# Patient Record
Sex: Female | Born: 1950 | Race: White | Hispanic: No | Marital: Single | Smoking: Never smoker
Health system: Southern US, Community
[De-identification: ages and names within clinical notes are randomized; demographics above are authoritative.]

## PROBLEM LIST (undated history)

## (undated) DIAGNOSIS — F32A Depression, unspecified: Secondary | ICD-10-CM

## (undated) DIAGNOSIS — D860 Sarcoidosis of lung: Secondary | ICD-10-CM

## (undated) DIAGNOSIS — G4730 Sleep apnea, unspecified: Secondary | ICD-10-CM

## (undated) DIAGNOSIS — I10 Essential (primary) hypertension: Secondary | ICD-10-CM

## (undated) DIAGNOSIS — F329 Major depressive disorder, single episode, unspecified: Secondary | ICD-10-CM

## (undated) DIAGNOSIS — Z9109 Other allergy status, other than to drugs and biological substances: Secondary | ICD-10-CM

## (undated) DIAGNOSIS — M1990 Unspecified osteoarthritis, unspecified site: Secondary | ICD-10-CM

## (undated) HISTORY — PX: TONSILLECTOMY: SUR1361

## (undated) HISTORY — PX: ABDOMINAL HYSTERECTOMY: SHX81

---

## 1991-03-23 HISTORY — PX: BREAST SURGERY: SHX581

## 1993-03-22 HISTORY — PX: NASAL SINUS SURGERY: SHX719

## 1998-03-22 HISTORY — PX: ROTATOR CUFF REPAIR: SHX139

## 2000-03-22 DIAGNOSIS — D860 Sarcoidosis of lung: Secondary | ICD-10-CM

## 2000-03-22 HISTORY — DX: Sarcoidosis of lung: D86.0

## 2005-03-22 HISTORY — PX: JOINT REPLACEMENT: SHX530

## 2006-02-21 ENCOUNTER — Ambulatory Visit: Payer: Self-pay | Admitting: Internal Medicine

## 2007-12-27 ENCOUNTER — Encounter: Admission: RE | Admit: 2007-12-27 | Discharge: 2007-12-27 | Payer: Self-pay | Admitting: Family Medicine

## 2008-03-22 HISTORY — PX: LAPAROSCOPIC GASTRIC BANDING: SHX1100

## 2009-01-22 ENCOUNTER — Encounter: Admission: RE | Admit: 2009-01-22 | Discharge: 2009-01-22 | Payer: Self-pay | Admitting: Family Medicine

## 2010-01-26 ENCOUNTER — Encounter: Admission: RE | Admit: 2010-01-26 | Discharge: 2010-01-26 | Payer: Self-pay | Admitting: Family Medicine

## 2010-04-13 ENCOUNTER — Encounter: Payer: Self-pay | Admitting: Family Medicine

## 2010-08-07 NOTE — Assessment & Plan Note (Signed)
[facility]                             PULMONARY OFFICE NOTE   BRITTANI, PURDUM                         MRN:          [id number]  DATE:02/21/2006                            DOB:          1950-07-30    Monday, February 21, 2006   REFERRING PHYSICIAN:  Tally Joe, M.D.   REASON FOR CONSULTATION:  Sarcoidosis evaluation.   HISTORY:  A 60 year old white female never smoker with a history of  moderate obesity and chronic fatigue that crescendoed in 2005 associated  with cough and dyspnea with an abnormal chest x-ray leading ultimately  to a transbronchial biopsy showing noncaseating granulomas in a study  dated April 2005.  She was placed on prednisone initially she says at 20  mg per day but required up to 60 mg per day because of refractory  symptoms of cough, dyspnea and fatigue with marked elevation of ACE  levels (not available, however, for review today).  Ultimately, she was  weaned off of prednisone and has not been on it for over a year with no  significant flare up of symptoms.  She has a few myalgias and  arthralgias but nothing out of the ordinary for her.  She denies any  ocular complaints, fevers, chills, sweats, significant dyspnea or cough  or rash.   PAST MEDICAL HISTORY:  1. Hypertension.  2. She is status post hysterectomy.  3. Sinus surgery.   ALLERGIES:  NO KNOWN DRUG ALLERGIES.   MEDICATIONS:  Taken in detail on the worksheet column dated February 21, 2006, correct as listed.   SOCIAL HISTORY:  She has never smoked.  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   FAMILY HISTORY:  Positive for heart disease in her mother.  Negative for  sarcoid or rheumatism.   REVIEW OF SYSTEMS:  Taken in detail on worksheet and negative except as  outlined above.   PHYSICAL EXAMINATION:  GENERAL APPEARANCE:  This is a pleasant,  ambulatory, obese white female weighing now at 238 (versus a peak of 263  on prednisone).  HEENT:  A limited funduscopy revealing no obvious arterial change.  Ocular exam was completely benign.  Oropharynx is clear.  Dentition is  intact.  Ear canals clear bilaterally.  NECK:  Supple without cervical adenopathy or tenderness.  Trachea is  midline.  No thyromegaly.  LUNGS:  Lung fields are perfectly clear to auscultation and percussion  bilaterally with no adventitial breath sounds.  CARDIOVASCULAR:  There is a regular rhythm without murmurs, rubs, or  gallops.  No increase P2.  ABDOMEN:  Obese, benign, no palpable organomegaly, masses or tenderness.  EXTREMITIES:  Warm without calf tenderness, clubbing, cyanosis, or  edema.  NEUROLOGIC:  No deficits present.  SKIN:  Unremarkable.  MUSCULOSKELETAL:  Unremarkable in terms of hip, knees, hands and wrists  with no evidence of active synovitis.   Chest x-ray was performed at [facility] within the last month, it has  been requested.   IMPRESSION:  No evidence of active sarcoidosis having been diagnosed by  transbronchial biopsy in April 2005 and on  prednisone for about a year,  albeit at much higher doses than we normally recommend here.  She has  not had any symptoms that would suggest recurrent disease since she  finished the prednisone over a year ago.  She has some mild myalgias and  arthralgias which are probably related to the effect of excess weight on  joints involving the hips and knees (whereas most patients with sarcoid  have prominent hand, wrist and ankle involvement).   I reviewed with the patient the natural history of sarcoid including the  fact that it tends to burn itself out in most patients by the end of  three years, which she is now approaching.  I did not recommend any  specific therapy but did give her symptoms to look for that would  indicate the need to return here with her old x-rays available for  comparison purposes.  Otherwise, all follow-up can be through Dr.  Merita Norton office with a chest x-ray  yearly.     Charlaine Dalton. Sherene Sires, MD, [facility]  Electronically Signed    MBW/MedQ  DD: 02/21/2006  DT: 02/22/2006  Job #: [id number]

## 2011-03-23 HISTORY — PX: COMBINED ABDOMINOPLASTY AND LIPOSUCTION: SUR284

## 2011-10-07 ENCOUNTER — Other Ambulatory Visit: Payer: Self-pay | Admitting: Orthopedic Surgery

## 2011-10-13 ENCOUNTER — Encounter (HOSPITAL_COMMUNITY): Payer: Self-pay | Admitting: Respiratory Therapy

## 2011-10-14 ENCOUNTER — Encounter (HOSPITAL_COMMUNITY): Payer: Self-pay

## 2011-10-14 ENCOUNTER — Encounter (HOSPITAL_COMMUNITY)
Admission: RE | Admit: 2011-10-14 | Discharge: 2011-10-14 | Disposition: A | Discharge status: Home / Self Care | Payer: BC Managed Care – PPO | Source: Ambulatory Visit | Attending: Orthopedic Surgery | Admitting: Orthopedic Surgery

## 2011-10-14 HISTORY — DX: Sarcoidosis of lung: D86.0

## 2011-10-14 HISTORY — DX: Sleep apnea, unspecified: G47.30

## 2011-10-14 HISTORY — DX: Other allergy status, other than to drugs and biological substances: Z91.09

## 2011-10-14 HISTORY — DX: Essential (primary) hypertension: I10

## 2011-10-14 HISTORY — DX: Depression, unspecified: F32.A

## 2011-10-14 HISTORY — DX: Major depressive disorder, single episode, unspecified: F32.9

## 2011-10-14 HISTORY — DX: Unspecified osteoarthritis, unspecified site: M19.90

## 2011-10-14 LAB — CBC
Hemoglobin: 12.9 g/dL (ref 12.0–15.0)
MCHC: 33.8 g/dL (ref 30.0–36.0)
RBC: 4.19 MIL/uL (ref 3.87–5.11)

## 2011-10-14 LAB — URINALYSIS, ROUTINE W REFLEX MICROSCOPIC
Nitrite: NEGATIVE
Specific Gravity, Urine: 1.023 (ref 1.005–1.030)
pH: 6.5 (ref 5.0–8.0)

## 2011-10-14 LAB — COMPREHENSIVE METABOLIC PANEL
ALT: 19 U/L (ref 0–35)
Alkaline Phosphatase: 85 U/L (ref 39–117)
GFR calc Af Amer: >90 mL/min (ref >90)
Glucose, Bld: 79 mg/dL (ref 70–99)
Potassium: 4.2 mEq/L (ref 3.5–5.1)
Sodium: 140 mEq/L (ref 135–145)
Total Protein: 6.7 g/dL (ref 6.0–8.3)

## 2011-10-14 LAB — SURGICAL PCR SCREEN
MRSA, PCR: NEGATIVE
Staphylococcus aureus: NEGATIVE

## 2011-10-14 LAB — DIFFERENTIAL
Eosinophils Absolute: 0.2 10*3/uL (ref 0.0–0.7)
Eosinophils Relative: 2 % (ref 0–5)
Lymphs Abs: 2.7 10*3/uL (ref 0.7–4.0)
Monocytes Absolute: 0.9 10*3/uL (ref 0.1–1.0)
Monocytes Relative: 8 % (ref 3–12)

## 2011-10-14 LAB — APTT: aPTT: 28 seconds (ref 24–37)

## 2011-10-14 LAB — PROTIME-INR: Prothrombin Time: 12.4 seconds (ref 11.6–15.2)

## 2011-10-14 LAB — URINE MICROSCOPIC-ADD ON

## 2011-10-14 LAB — TYPE AND SCREEN

## 2011-10-14 NOTE — Pre-Procedure Instructions (Signed)
20 Lacey Silva  10/14/2011   Your procedure is scheduled on:  July 31st  Report to [facility] at 1100 AM.  Call this number if you have problems the morning of surgery: [phone]   Remember:   Do not eat food or drink:After Midnight.  Take these medicines the morning of surgery with A SIP OF WATER: cymbalta, claritin   Do not wear jewelry, make-up or nail polish.  Do not wear lotions, powders, or perfumes.   Do not shave 48 hours prior to surgery. Men may shave face and neck.  Do not bring valuables to the hospital.  Contacts, dentures or bridgework may not be worn into surgery.  Leave suitcase in the car. After surgery it may be brought to your room.  For patients admitted to the hospital, checkout time is 11:00 AM the day of discharge.   Patients discharged the day of surgery will not be allowed to drive home.  Special Instructions: CHG Shower Use Special Wash: 1/2 bottle night before surgery and 1/2 bottle morning of surgery.   Please read over the following fact sheets that you were given: Pain Booklet, Coughing and Deep Breathing, MRSA Information and Surgical Site Infection Prevention

## 2011-10-15 NOTE — Anesthesia Preprocedure Evaluation (Addendum)
Anesthesia Evaluation  Patient identified by MRN, date of birth, ID band Patient awake  General Assessment Comment:Glidescope utilized with last two surgeries at [facility].  Reviewed: Allergy & Precautions, H&P , NPO status , Patient's Chart, lab work & pertinent test results  History of Anesthesia Complications (+) DIFFICULT AIRWAY  Airway Mallampati: II TM Distance: <3 FB Neck ROM: full    Dental  (+) Teeth Intact and Dental Advisory Given   Pulmonary sleep apnea and Continuous Positive Airway Pressure Ventilation ,  breath sounds clear to auscultation        Cardiovascular hypertension, Pt. on medications Rhythm:regular Rate:Normal     Neuro/Psych PSYCHIATRIC DISORDERS    GI/Hepatic   Endo/Other    Renal/GU      Musculoskeletal   Abdominal   Peds  Hematology   Anesthesia Other Findings   Reproductive/Obstetrics                         Anesthesia Physical Anesthesia Plan  ASA: II  Anesthesia Plan: General   Post-op Pain Management:    Induction: Intravenous  Airway Management Planned: Oral ETT  Additional Equipment:   Intra-op Plan:   Post-operative Plan: Extubation in OR  Informed Consent: I have reviewed the patients History and Physical, chart, labs and discussed the procedure including the risks, benefits and alternatives for the proposed anesthesia with the patient or authorized representative who has indicated his/her understanding and acceptance.     Plan Discussed with: CRNA, Anesthesiologist and Surgeon  Anesthesia Plan Comments:         Anesthesia Quick Evaluation

## 2011-10-15 NOTE — Consult Note (Signed)
Anesthesia Chart Review:  Patient is a 61 year old female scheduled for C4-5, C5-6, C6-7 ACDF on 10/20/11 by Dr. Yevette Edwards.  History includes HTN, non-smoker, OSA, Sarcoidosis arthritis, obesity with BMI 38 s/p gastric banding procedure, depression, hysterectomy, joint replacement, breast and nasal sinus surgeries.    She also reports that years ago in [place] she was told she was a DIFFICULT INTUBATION due to a "short neck."  She has since had two surgeries at [facility] where she told them of her anesthesia history, and did not have any significant trouble.  I reviewed her 10/24/06 (TKR) and 02/13/08 (laparoscopic gastric banding) anesthesia records from [facility].  A glidescope was utilized.  Records are on her chart for her Anesthesiologist to review on the day of surgery.    Labs noted.  CXR from 10/14/11 showed: Findings: Cardiac silhouette is upper normal size. There is slight ectasia and tortuosity of thoracic aorta. No pulmonary infiltrates or nodules are evident. The No pleural abnormality is evident. There is slightly osteopenic appearance of the bones. Changes of degenerative disc disease and degenerative spondylosis are seen. A lap band device is seen in the upper abdomen.   Her EKG done on 10/14/11, while believed to be her EKG, did not show exact patient identifiers, so it was deleted and will need to be done on the day of surgery.  We did receive a prior EKG from [facility] from 01/15/08 that showed SR, poor R wave progression, low r waves versus Q waves in III, aVF.  (The EKG deleted--but with incorrect identifiers-- showed similar findings.)  If her EKG is stable on the day of surgery then anticipate she could proceed as planned.  I reviewed her EKG events and difficult intubation history with Anesthesiologist Dr. Michelle Piper.    Shonna Chock, PA-C

## 2011-10-19 MED ORDER — CLINDAMYCIN PHOSPHATE 900 MG/50ML IV SOLN
900.0000 mg | INTRAVENOUS | Status: DC
  Filled 2011-10-19: qty 50

## 2011-10-19 MED ORDER — POVIDONE-IODINE 7.5 % EX SOLN
Freq: Once | CUTANEOUS | Status: DC
  Filled 2011-10-19: qty 118

## 2011-10-20 ENCOUNTER — Observation Stay (HOSPITAL_COMMUNITY)
Admission: RE | Admit: 2011-10-20 | Discharge: 2011-10-21 | DRG: 865 | Disposition: A | Discharge status: Home / Self Care | Payer: BC Managed Care – PPO | Source: Ambulatory Visit | Attending: Orthopedic Surgery | Admitting: Orthopedic Surgery

## 2011-10-20 ENCOUNTER — Ambulatory Visit (HOSPITAL_COMMUNITY): Payer: BC Managed Care – PPO

## 2011-10-20 ENCOUNTER — Encounter (HOSPITAL_COMMUNITY): Payer: Self-pay | Admitting: Vascular Surgery

## 2011-10-20 ENCOUNTER — Ambulatory Visit (HOSPITAL_COMMUNITY): Payer: BC Managed Care – PPO | Admitting: Vascular Surgery

## 2011-10-20 ENCOUNTER — Encounter (HOSPITAL_COMMUNITY): Payer: Self-pay | Admitting: Certified Registered Nurse Anesthetist

## 2011-10-20 ENCOUNTER — Encounter (HOSPITAL_COMMUNITY)
Admission: RE | Disposition: A | Discharge status: Home / Self Care | Payer: Self-pay | Source: Ambulatory Visit | Attending: Orthopedic Surgery

## 2011-10-20 ENCOUNTER — Encounter (HOSPITAL_COMMUNITY): Payer: Self-pay | Admitting: *Deleted

## 2011-10-20 DIAGNOSIS — M5000 Cervical disc disorder with myelopathy, unspecified cervical region: Principal | ICD-10-CM | POA: Insufficient documentation

## 2011-10-20 DIAGNOSIS — Z01818 Encounter for other preprocedural examination: Secondary | ICD-10-CM | POA: Insufficient documentation

## 2011-10-20 DIAGNOSIS — M1990 Unspecified osteoarthritis, unspecified site: Secondary | ICD-10-CM | POA: Insufficient documentation

## 2011-10-20 DIAGNOSIS — I10 Essential (primary) hypertension: Secondary | ICD-10-CM | POA: Insufficient documentation

## 2011-10-20 DIAGNOSIS — Z01812 Encounter for preprocedural laboratory examination: Secondary | ICD-10-CM | POA: Insufficient documentation

## 2011-10-20 DIAGNOSIS — G959 Disease of spinal cord, unspecified: Secondary | ICD-10-CM

## 2011-10-20 DIAGNOSIS — Z01811 Encounter for preprocedural respiratory examination: Secondary | ICD-10-CM | POA: Insufficient documentation

## 2011-10-20 HISTORY — PX: ANTERIOR CERVICAL DECOMP/DISCECTOMY FUSION: SHX1161

## 2011-10-20 SURGERY — ANTERIOR CERVICAL DECOMPRESSION/DISCECTOMY FUSION 3 LEVELS
Anesthesia: General | Site: Neck | Laterality: Right | Wound class: Clean

## 2011-10-20 MED ORDER — SUCCINYLCHOLINE CHLORIDE 20 MG/ML IJ SOLN
INTRAMUSCULAR | Status: DC | PRN
  Administered 2011-10-20: 120 mg via INTRAVENOUS

## 2011-10-20 MED ORDER — LACTATED RINGERS IV SOLN
INTRAVENOUS | Status: DC
  Administered 2011-10-20: 12:00:00 via INTRAVENOUS

## 2011-10-20 MED ORDER — 0.9 % SODIUM CHLORIDE (POUR BTL) OPTIME
TOPICAL | Status: DC | PRN
  Administered 2011-10-20: 1000 mL

## 2011-10-20 MED ORDER — HYDROMORPHONE HCL PF 1 MG/ML IJ SOLN: 0.2500 mg | INTRAMUSCULAR | Status: DC | PRN

## 2011-10-20 MED ORDER — MENTHOL 3 MG MT LOZG: 1.0000 | LOZENGE | OROMUCOSAL | Status: DC | PRN

## 2011-10-20 MED ORDER — BUPIVACAINE-EPINEPHRINE PF 0.25-1:200000 % IJ SOLN
INTRAMUSCULAR | Status: DC | PRN
  Administered 2011-10-20: 2 mL

## 2011-10-20 MED ORDER — ADULT MULTIVITAMIN W/MINERALS CH
1.0000 | ORAL_TABLET | Freq: Every day | ORAL | Status: DC
  Administered 2011-10-20 – 2011-10-21 (×2): 1 via ORAL
  Filled 2011-10-20 (×2): qty 1

## 2011-10-20 MED ORDER — ALUM & MAG HYDROXIDE-SIMETH 200-200-20 MG/5ML PO SUSP: 30.0000 mL | Freq: Four times a day (QID) | ORAL | Status: DC | PRN

## 2011-10-20 MED ORDER — SODIUM CHLORIDE 0.9 % IV SOLN: 250.0000 mL | INTRAVENOUS | Status: DC

## 2011-10-20 MED ORDER — LOSARTAN POTASSIUM-HCTZ 50-12.5 MG PO TABS: 1.0000 | ORAL_TABLET | Freq: Every day | ORAL | Status: DC

## 2011-10-20 MED ORDER — CEFAZOLIN SODIUM-DEXTROSE 2-3 GM-% IV SOLR
INTRAVENOUS | Status: AC
  Filled 2011-10-20: qty 50

## 2011-10-20 MED ORDER — GLYCOPYRROLATE 0.2 MG/ML IJ SOLN
INTRAMUSCULAR | Status: DC | PRN
  Administered 2011-10-20: .5 mg via INTRAVENOUS

## 2011-10-20 MED ORDER — CEFAZOLIN SODIUM 1-5 GM-% IV SOLN
1.0000 g | Freq: Three times a day (TID) | INTRAVENOUS | Status: AC
  Administered 2011-10-20 – 2011-10-21 (×2): 1 g via INTRAVENOUS
  Filled 2011-10-20 (×2): qty 50

## 2011-10-20 MED ORDER — PROPOFOL 10 MG/ML IV EMUL
INTRAVENOUS | Status: DC | PRN
  Administered 2011-10-20: 280 mg via INTRAVENOUS

## 2011-10-20 MED ORDER — DEXAMETHASONE SODIUM PHOSPHATE 10 MG/ML IJ SOLN
INTRAMUSCULAR | Status: DC | PRN
  Administered 2011-10-20: 10 mg via INTRAVENOUS

## 2011-10-20 MED ORDER — THROMBIN 20000 UNITS EX KIT
PACK | CUTANEOUS | Status: DC | PRN
  Administered 2011-10-20: 14:00:00 via TOPICAL

## 2011-10-20 MED ORDER — VECURONIUM BROMIDE 10 MG IV SOLR
INTRAVENOUS | Status: DC | PRN
  Administered 2011-10-20: 2 mg via INTRAVENOUS
  Administered 2011-10-20: 1 mg via INTRAVENOUS
  Administered 2011-10-20: 3 mg via INTRAVENOUS
  Administered 2011-10-20: 1 mg via INTRAVENOUS
  Administered 2011-10-20 (×2): 2 mg via INTRAVENOUS

## 2011-10-20 MED ORDER — ACETAMINOPHEN 10 MG/ML IV SOLN
INTRAVENOUS | Status: DC | PRN
  Administered 2011-10-20: 1000 mg via INTRAVENOUS

## 2011-10-20 MED ORDER — DOCUSATE SODIUM 100 MG PO CAPS
100.0000 mg | ORAL_CAPSULE | Freq: Two times a day (BID) | ORAL | Status: DC
  Administered 2011-10-20 – 2011-10-21 (×2): 100 mg via ORAL
  Filled 2011-10-20 (×3): qty 1

## 2011-10-20 MED ORDER — BUPIVACAINE-EPINEPHRINE PF 0.25-1:200000 % IJ SOLN
INTRAMUSCULAR | Status: AC
  Filled 2011-10-20: qty 30

## 2011-10-20 MED ORDER — LIDOCAINE HCL (CARDIAC) 20 MG/ML IV SOLN
INTRAVENOUS | Status: DC | PRN
  Administered 2011-10-20: 80 mg via INTRAVENOUS

## 2011-10-20 MED ORDER — LORATADINE 10 MG PO TABS
10.0000 mg | ORAL_TABLET | Freq: Every day | ORAL | Status: DC
  Administered 2011-10-20 – 2011-10-21 (×2): 10 mg via ORAL
  Filled 2011-10-20 (×2): qty 1

## 2011-10-20 MED ORDER — MORPHINE SULFATE 2 MG/ML IJ SOLN
2.0000 mg | INTRAMUSCULAR | Status: DC | PRN
  Administered 2011-10-20: 2 mg via INTRAVENOUS
  Filled 2011-10-20: qty 1

## 2011-10-20 MED ORDER — DIAZEPAM 5 MG PO TABS: 5.0000 mg | ORAL_TABLET | Freq: Four times a day (QID) | ORAL | Status: DC | PRN

## 2011-10-20 MED ORDER — ACETAMINOPHEN 325 MG PO TABS: 650.0000 mg | ORAL_TABLET | ORAL | Status: DC | PRN

## 2011-10-20 MED ORDER — PHENOL 1.4 % MT LIQD: 1.0000 | OROMUCOSAL | Status: DC | PRN

## 2011-10-20 MED ORDER — SODIUM CHLORIDE 0.9 % IJ SOLN: 3.0000 mL | INTRAMUSCULAR | Status: DC | PRN

## 2011-10-20 MED ORDER — FENTANYL CITRATE 0.05 MG/ML IJ SOLN
INTRAMUSCULAR | Status: DC | PRN
  Administered 2011-10-20 (×5): 50 ug via INTRAVENOUS

## 2011-10-20 MED ORDER — ACETAMINOPHEN 10 MG/ML IV SOLN
INTRAVENOUS | Status: AC
  Filled 2011-10-20: qty 100

## 2011-10-20 MED ORDER — HYDROCHLOROTHIAZIDE 12.5 MG PO CAPS
12.5000 mg | ORAL_CAPSULE | Freq: Every day | ORAL | Status: DC
  Administered 2011-10-21: 12.5 mg via ORAL
  Filled 2011-10-20: qty 1

## 2011-10-20 MED ORDER — ONDANSETRON HCL 4 MG/2ML IJ SOLN: 4.0000 mg | Freq: Once | INTRAMUSCULAR | Status: DC | PRN

## 2011-10-20 MED ORDER — DULOXETINE HCL 30 MG PO CPEP
30.0000 mg | ORAL_CAPSULE | Freq: Every day | ORAL | Status: DC
  Administered 2011-10-20 – 2011-10-21 (×2): 30 mg via ORAL
  Filled 2011-10-20 (×2): qty 1

## 2011-10-20 MED ORDER — LIDOCAINE HCL 4 % MT SOLN
OROMUCOSAL | Status: DC | PRN
  Administered 2011-10-20: 4 mL via TOPICAL

## 2011-10-20 MED ORDER — ONDANSETRON HCL 4 MG/2ML IJ SOLN: 4.0000 mg | INTRAMUSCULAR | Status: DC | PRN

## 2011-10-20 MED ORDER — CALCIUM POLYCARBOPHIL 625 MG PO TABS
1250.0000 mg | ORAL_TABLET | Freq: Every day | ORAL | Status: DC
  Administered 2011-10-20: 1250 mg via ORAL
  Filled 2011-10-20 (×2): qty 2

## 2011-10-20 MED ORDER — SODIUM CHLORIDE 0.9 % IJ SOLN: 3.0000 mL | Freq: Two times a day (BID) | INTRAMUSCULAR | Status: DC

## 2011-10-20 MED ORDER — EPHEDRINE SULFATE 50 MG/ML IJ SOLN
INTRAMUSCULAR | Status: DC | PRN
  Administered 2011-10-20: 10 mg via INTRAVENOUS

## 2011-10-20 MED ORDER — VITAMIN D (ERGOCALCIFEROL) 1.25 MG (50000 UNIT) PO CAPS
50000.0000 [IU] | ORAL_CAPSULE | ORAL | Status: DC
  Filled 2011-10-20 (×2): qty 1

## 2011-10-20 MED ORDER — LACTATED RINGERS IV SOLN
INTRAVENOUS | Status: DC | PRN
  Administered 2011-10-20 (×2): via INTRAVENOUS

## 2011-10-20 MED ORDER — CEFAZOLIN SODIUM 1-5 GM-% IV SOLN
INTRAVENOUS | Status: DC | PRN
  Administered 2011-10-20: 2 g via INTRAVENOUS

## 2011-10-20 MED ORDER — ONDANSETRON HCL 4 MG/2ML IJ SOLN
INTRAMUSCULAR | Status: DC | PRN
  Administered 2011-10-20: 4 mg via INTRAVENOUS

## 2011-10-20 MED ORDER — THROMBIN 20000 UNITS EX SOLR
CUTANEOUS | Status: AC
  Filled 2011-10-20: qty 20000

## 2011-10-20 MED ORDER — SENNA 8.6 MG PO TABS
1.0000 | ORAL_TABLET | Freq: Two times a day (BID) | ORAL | Status: DC
  Administered 2011-10-20 – 2011-10-21 (×2): 8.6 mg via ORAL
  Filled 2011-10-20 (×3): qty 1

## 2011-10-20 MED ORDER — POTASSIUM CHLORIDE IN NACL 20-0.9 MEQ/L-% IV SOLN
INTRAVENOUS | Status: DC
  Administered 2011-10-20: 20:00:00 via INTRAVENOUS
  Filled 2011-10-20 (×3): qty 1000

## 2011-10-20 MED ORDER — NEOSTIGMINE METHYLSULFATE 1 MG/ML IJ SOLN
INTRAMUSCULAR | Status: DC | PRN
  Administered 2011-10-20: 4 mg via INTRAVENOUS

## 2011-10-20 MED ORDER — LOSARTAN POTASSIUM 50 MG PO TABS
50.0000 mg | ORAL_TABLET | Freq: Every day | ORAL | Status: DC
  Administered 2011-10-21: 50 mg via ORAL
  Filled 2011-10-20: qty 1

## 2011-10-20 MED ORDER — OXYCODONE-ACETAMINOPHEN 5-325 MG PO TABS
1.0000 | ORAL_TABLET | ORAL | Status: DC | PRN
  Administered 2011-10-21: 2 via ORAL
  Filled 2011-10-20: qty 2
  Filled 2011-10-20: qty 1

## 2011-10-20 MED ORDER — PHENYLEPHRINE HCL 10 MG/ML IJ SOLN
INTRAMUSCULAR | Status: DC | PRN
  Administered 2011-10-20: 80 ug via INTRAVENOUS
  Administered 2011-10-20 (×3): 40 ug via INTRAVENOUS

## 2011-10-20 MED ORDER — ZOLPIDEM TARTRATE 5 MG PO TABS: 5.0000 mg | ORAL_TABLET | Freq: Every evening | ORAL | Status: DC | PRN

## 2011-10-20 MED ORDER — ACETAMINOPHEN 650 MG RE SUPP: 650.0000 mg | RECTAL | Status: DC | PRN

## 2011-10-20 SURGICAL SUPPLY — 75 items
APL SKNCLS STERI-STRIP NONHPOA (GAUZE/BANDAGES/DRESSINGS) ×1
BENZOIN TINCTURE PRP APPL 2/3 (GAUZE/BANDAGES/DRESSINGS) ×2 IMPLANT
BIT DRILL NEURO 2X3.1 SFT TUCH (MISCELLANEOUS) ×1 IMPLANT
BLADE LONG MED 31X9 (MISCELLANEOUS) IMPLANT
BLADE SURG 15 STRL LF DISP TIS (BLADE) ×1 IMPLANT
BLADE SURG 15 STRL SS (BLADE) ×2
BLADE SURG ROTATE 9660 (MISCELLANEOUS) ×2 IMPLANT
BUR MATCHSTICK NEURO 3.0 LAGG (BURR) ×2 IMPLANT
CARTRIDGE OIL MAESTRO DRILL (MISCELLANEOUS) ×1 IMPLANT
CLOTH BEACON ORANGE TIMEOUT ST (SAFETY) ×2 IMPLANT
CLSR STERI-STRIP ANTIMIC 1/2X4 (GAUZE/BANDAGES/DRESSINGS) ×1 IMPLANT
COLLAR CERV LO CONTOUR FIRM DE (SOFTGOODS) IMPLANT
CORDS BIPOLAR (ELECTRODE) ×2 IMPLANT
COVER SURGICAL LIGHT HANDLE (MISCELLANEOUS) ×2 IMPLANT
CRADLE DONUT ADULT HEAD (MISCELLANEOUS) ×2 IMPLANT
DEVICE ENDSKLTN CRVCL 5MM-0SM (Orthopedic Implant) IMPLANT
DEVICE ENDSKLTN TCERV VBR SM 6 (Orthopedic Implant) IMPLANT
DIFFUSER DRILL AIR PNEUMATIC (MISCELLANEOUS) ×2 IMPLANT
DRAIN JACKSON RD 7FR 3/32 (WOUND CARE) IMPLANT
DRAPE C-ARM 42X72 X-RAY (DRAPES) ×2 IMPLANT
DRAPE POUCH INSTRU U-SHP 10X18 (DRAPES) ×2 IMPLANT
DRAPE SURG 17X23 STRL (DRAPES) ×6 IMPLANT
DRILL NEURO 2X3.1 SOFT TOUCH (MISCELLANEOUS) ×4
DURAPREP 26ML APPLICATOR (WOUND CARE) ×2 IMPLANT
ELECT COATED BLADE 2.86 ST (ELECTRODE) ×2 IMPLANT
ELECT REM PT RETURN 9FT ADLT (ELECTROSURGICAL) ×2
ELECTRODE REM PT RTRN 9FT ADLT (ELECTROSURGICAL) ×1 IMPLANT
ENDOSKELETON CERVICAL 5MM-0SM (Orthopedic Implant) ×4 IMPLANT
ENDOSKELETON T CERV VBR SM 6MM (Orthopedic Implant) ×2 IMPLANT
EVACUATOR SILICONE 100CC (DRAIN) IMPLANT
GAUZE SPONGE 4X4 16PLY XRAY LF (GAUZE/BANDAGES/DRESSINGS) ×2 IMPLANT
GLOVE BIO SURGEON STRL SZ8 (GLOVE) ×2 IMPLANT
GLOVE BIOGEL PI IND STRL 8 (GLOVE) ×1 IMPLANT
GLOVE BIOGEL PI INDICATOR 8 (GLOVE) ×1
GOWN STRL NON-REIN LRG LVL3 (GOWN DISPOSABLE) ×2 IMPLANT
GOWN STRL REIN XL XLG (GOWN DISPOSABLE) ×2 IMPLANT
IV CATH 14GX2 1/4 (CATHETERS) ×2 IMPLANT
KIT BASIN OR (CUSTOM PROCEDURE TRAY) ×2 IMPLANT
KIT ROOM TURNOVER OR (KITS) ×2 IMPLANT
MANIFOLD NEPTUNE II (INSTRUMENTS) ×2 IMPLANT
NDL SPNL 20GX3.5 QUINCKE YW (NEEDLE) ×1 IMPLANT
NEEDLE 27GAX1X1/2 (NEEDLE) ×2 IMPLANT
NEEDLE SPNL 20GX3.5 QUINCKE YW (NEEDLE) ×2 IMPLANT
NS IRRIG 1000ML POUR BTL (IV SOLUTION) ×2 IMPLANT
OIL CARTRIDGE MAESTRO DRILL (MISCELLANEOUS) ×2
PACK ORTHO CERVICAL (CUSTOM PROCEDURE TRAY) ×2 IMPLANT
PAD ARMBOARD 7.5X6 YLW CONV (MISCELLANEOUS) ×4 IMPLANT
PATTIES SURGICAL .5 X.5 (GAUZE/BANDAGES/DRESSINGS) IMPLANT
PATTIES SURGICAL .5 X1 (DISPOSABLE) IMPLANT
PIN DISTRACTION 14 (PIN) ×2 IMPLANT
PIN DISTRACTION 14MM (PIN) IMPLANT
PLATE VECTRA 45MM (Plate) ×1 IMPLANT
PUTTY BONE DBX 5CC MIX (Putty) ×1 IMPLANT
SCREW 4.0X14MM (Screw) ×14 IMPLANT
SCREW 4.0X16MM (Screw) ×1 IMPLANT
SCREW BN 14X4XSLF DRL VA SLF (Screw) IMPLANT
SPONGE GAUZE 4X4 12PLY (GAUZE/BANDAGES/DRESSINGS) ×2 IMPLANT
SPONGE INTESTINAL PEANUT (DISPOSABLE) ×2 IMPLANT
SPONGE SURGIFOAM ABS GEL 100 (HEMOSTASIS) IMPLANT
STRIP CLOSURE SKIN 1/2X4 (GAUZE/BANDAGES/DRESSINGS) ×2 IMPLANT
SURGIFLO TRUKIT (HEMOSTASIS) IMPLANT
SUT MNCRL AB 4-0 PS2 18 (SUTURE) IMPLANT
SUT SILK 4 0 (SUTURE)
SUT SILK 4-0 18XBRD TIE 12 (SUTURE) IMPLANT
SUT VIC AB 2-0 CT2 18 VCP726D (SUTURE) ×2 IMPLANT
SYR BULB IRRIGATION 50ML (SYRINGE) ×2 IMPLANT
SYR CONTROL 10ML LL (SYRINGE) ×2 IMPLANT
TAPE CLOTH 4X10 WHT NS (GAUZE/BANDAGES/DRESSINGS) ×2 IMPLANT
TAPE CLOTH SURG 4X10 WHT LF (GAUZE/BANDAGES/DRESSINGS) ×1 IMPLANT
TAPE UMBILICAL COTTON 1/8X30 (MISCELLANEOUS) ×4 IMPLANT
TOWEL OR 17X24 6PK STRL BLUE (TOWEL DISPOSABLE) ×2 IMPLANT
TOWEL OR 17X26 10 PK STRL BLUE (TOWEL DISPOSABLE) ×2 IMPLANT
TRAY FOLEY CATH 14FR (SET/KITS/TRAYS/PACK) ×2 IMPLANT
WATER STERILE IRR 1000ML POUR (IV SOLUTION) ×2 IMPLANT
YANKAUER SUCT BULB TIP NO VENT (SUCTIONS) ×2 IMPLANT

## 2011-10-20 NOTE — Transfer of Care (Signed)
Immediate Anesthesia Transfer of Care Note  Patient: Lacey Silva  Procedure(s) Performed: Procedure(s) (LRB): ANTERIOR CERVICAL DECOMPRESSION/DISCECTOMY FUSION 3 LEVELS (Right)  Patient Location: PACU  Anesthesia Type: General  Level of Consciousness: awake, alert  and patient cooperative  Airway & Oxygen Therapy: Patient Spontanous Breathing and Patient connected to face mask oxygen  Post-op Assessment: Report given to PACU RN, Post -op Vital signs reviewed and stable and Patient moving all extremities X 4  Post vital signs: Reviewed and stable  Complications: No apparent anesthesia complications

## 2011-10-20 NOTE — Progress Notes (Signed)
Pneu vac  Taken in 2013

## 2011-10-20 NOTE — H&P (Signed)
PREOPERATIVE H&P  Chief Complaint: Deterioration in fine motor skills and balance  HPI: Lacey Silva is a 61 y.o. female who presents with symptoms c/w cervical myelopathy  Past Medical History  Diagnosis Date  . Difficult intubation     short neck  . Hypertension   . Depression     mild and controlled  . Environmental allergies   . Sarcoidosis of lung 2002    in remission; sees MD in [place] [facility]  . Sleep apnea     uses cpap nightly  . Arthritis     osteoarthritis   Past Surgical History  Procedure Date  . Laparoscopic gastric banding 2010    [place]  . Joint replacement 2007    Right Knee  . Abdominal hysterectomy   . Rotator cuff repair 2000    Right  . Breast surgery 1993    Breast reduction  . Nasal sinus surgery 1995  . Tonsillectomy   . Combined abdominoplasty and liposuction 2013   History   Social History  . Marital Status: Single    Spouse Name: N/A    Number of Children: N/A  . Years of Education: N/A   Social History Main Topics  . Smoking status: Never Smoker   . Smokeless tobacco: Never Used  . Alcohol Use: Yes     occasional  . Drug Use: No  . Sexually Active: No   Other Topics Concern  . Not on file   Social History Narrative  . No narrative on file   No family history on file. Allergies  Allergen Reactions  . Codeine Nausea And Vomiting   Prior to Admission medications   Medication Sig Start Date End Date Taking? Authorizing Provider  aspirin 81 MG tablet Take 81 mg by mouth daily.   Yes Historical Provider, MD  DULoxetine (CYMBALTA) 30 MG capsule Take 30 mg by mouth daily.   Yes Historical Provider, MD  loratadine (CLARITIN) 10 MG tablet Take 10 mg by mouth daily.   Yes Historical Provider, MD  losartan-hydrochlorothiazide (HYZAAR) 50-12.5 MG per tablet Take 1 tablet by mouth daily.   Yes Historical Provider, MD  Multiple Vitamin (MULTIVITAMIN WITH MINERALS) TABS Take 1 tablet by mouth daily.   Yes Historical  Provider, MD  polycarbophil (FIBERCON) 625 MG tablet Take 1,250 mg by mouth at bedtime.   Yes Historical Provider, MD  Vitamin D, Ergocalciferol, (DRISDOL) 50000 UNITS CAPS Take 50,000 Units by mouth 2 (two) times a week.   Yes Historical Provider, MD     All other systems have been reviewed and were otherwise negative with the exception of those mentioned in the HPI and as above.  Physical Exam: There were no vitals filed for this visit.  General: Alert, no acute distress Cardiovascular: No pedal edema Respiratory: No cyanosis, no use of accessory musculature GI: No organomegaly, abdomen is soft and non-tender Skin: No lesions in the area of chief complaint Neurologic: Sensation intact distally Psychiatric: Patient is competent for consent with normal mood and affect Lymphatic: No axillary or cervical lymphadenopathy  MUSCULOSKELETAL: + hoffman's bilaterally  Assessment/Plan: Myelopathy Plan for Procedure(s): ANTERIOR CERVICAL DECOMPRESSION/DISCECTOMY FUSION 3 LEVELS   Emilee Hero, MD 10/20/2011 7:31 AM

## 2011-10-20 NOTE — Anesthesia Postprocedure Evaluation (Signed)
  Anesthesia Post-op Note  Patient: Lacey Silva  Procedure(s) Performed: Procedure(s) (LRB): ANTERIOR CERVICAL DECOMPRESSION/DISCECTOMY FUSION 3 LEVELS (Right)  Patient Location: PACU  Anesthesia Type: General  Level of Consciousness: awake, alert  and oriented  Airway and Oxygen Therapy: Patient Spontanous Breathing  Post-op Pain: mild  Post-op Assessment: Post-op Vital signs reviewed  Post-op Vital Signs: Reviewed  Complications: No apparent anesthesia complications

## 2011-10-20 NOTE — Anesthesia Procedure Notes (Signed)
Procedure Name: Intubation Date/Time: 10/20/2011 12:53 PM Performed by: Lovie Chol Pre-anesthesia Checklist: Patient identified, Emergency Drugs available, Suction available, Patient being monitored and Timeout performed Oxygen Delivery Method: Circle system utilized Preoxygenation: Pre-oxygenation with 100% oxygen Intubation Type: IV induction Ventilation: Oral airway inserted - appropriate to patient size, Two handed mask ventilation required and Mask ventilation without difficulty Laryngoscope Size: Miller and 2 Grade View: Grade IV Tube type: Oral Tube size: 7.0 mm Number of attempts: 1 Airway Equipment and Method: Bougie stylet and LTA kit utilized Placement Confirmation: positive ETCO2 and breath sounds checked- equal and bilateral Secured at: 20 cm Tube secured with: Tape Dental Injury: Teeth and Oropharynx as per pre-operative assessment  Difficulty Due To: Difficulty was anticipated, Difficult Airway- due to reduced neck mobility and Difficult Airway- due to limited oral opening Future Recommendations: Recommend- induction with short-acting agent, and alternative techniques readily available

## 2011-10-20 NOTE — Preoperative (Signed)
Beta Blockers   Reason not to administer Beta Blockers:Not Applicable 

## 2011-10-21 ENCOUNTER — Encounter (HOSPITAL_COMMUNITY): Payer: Self-pay | Admitting: Orthopedic Surgery

## 2011-10-21 NOTE — Op Note (Signed)
NAME:  Lacey Silva, Lacey Silva                ACCOUNT NO.:  [id number]  MEDICAL RECORD NO.:  [id number]  LOCATION:  5N19C                        FACILITY:  [facility]  PHYSICIAN:  Estill Bamberg, MD      DATE OF BIRTH:  [birth year]  DATE OF PROCEDURE:  10/20/2011 DATE OF DISCHARGE:                              OPERATIVE REPORT   PREOPERATIVE DIAGNOSES: 1. Cervical myelopathy. 2. 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  POSTOPERATIVE DIAGNOSES: 1. Cervical myelopathy. 2. 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  PROCEDURE: 1. Anterior cervical decompression and fusion C4-5, C5-6, C6-7. 2. Placement of interbody device x3 (Titan interbody cages). 3. Placement of anterior instrumentation, C4, C5, C6, and C7. 4. Use of local autograft. 5. Use of morselized allograft (DBX mix). 6. Intraoperative use of fluoroscopy.  SURGEON:  Estill Bamberg, MD  ASSISTANT:  Janace Litten, PA  ANESTHESIA:  General endotracheal anesthesia.  COMPLICATIONS:  None.  DISPOSITION:  Stable.  ESTIMATED BLOOD LOSS:  Minimal.  INDICATIONS FOR PROCEDURE:  Briefly, Ms. Sferrazza is an extremely pleasant 61 year old female who initially was evaluated by me on October 01, 2011. On that date, the patient did have significant deterioration of her fine motor skills.  She particular noted deterioration of buttoning buttons and opening jars.  She does state that this problem had been increasing in severity over the past few months.  I did review an MRI, which was notable for profound degenerative disk disease at C4-5, C5-6, as well as C6-7 with varying degrees of spinal cord compression, which I did feel was correlating to the patient's symptoms.  We therefore had a discussion regarding going forward with C7 ACDF.  The patient fully understood the risks and limitations of the procedure as outlined in my preoperative note.  Of particular note, the patient did understand the goal of surgery was to prevent additional  deterioration of her myelopathy and not necessarily to reverse it.  OPERATIVE DETAILS:  On October 20, 2011, the patient was brought to surgery and general endotracheal anesthesia was administered.  The patient was placed supine on a hospital bed.  The neck was placed in a gentle degree of extension and the shoulders were taped to the inferior aspect of the bed.  The arms were secured to the sides and the ulnar nerves were protected and padded.  I then brought in lateral fluoroscopy to help optimize location of the transverse incision.  The neck was then prepped and draped in usual sterile fashion.  Time-out procedure was performed. 2 g of Ancef were given.  I then made an incision from the midline of the neck to the medial border of the sternocleidomastoid muscle on the left side overlying the C5-6 interspace.  The platysma was sharply incised.  The plane between the sternocleidomastoid muscle laterally and the strap muscles medially was identified and explored.  The anterior cervical spine was readily noted.  The prevertebral fascia was bluntly swept off the anterior spine.  I then placed a self-retaining Shadow- Line retractor.  I then obtained a lateral fluoroscopic view to confirm the appropriate operative levels.  I then subperiosteally exposed the vertebral bodies of C4, C5, C6, and C7.  I then turned  my attention towards the C6-7 interspace.  I then used a 15-blade knife to perform an annulotomy anteriorly.  I then went forward with a diskectomy in the usual fashion using a series of curettes and Kerrison punches.  Of note, there were profound osteophytes noted anteriorly and these were removed using a rongeur.  There was significant collapse also identified, which did make the procedure rather meticulous, however, I was able to perform a thorough and complete diskectomy to the level of the posterior longitudinal ligament.  I then placed a series of interbody trials and I did feel  that a 6-mm flat interbody device would be the most appropriate fit.  A small device was selected (12 x 14 mm).  The device was then packed with DBX mix in addition to autograft obtained for removing the osteophytes anteriorly and the interbody implant was tamped into position in the usual fashion.  I was very pleased with the final press fit.  I then turned my attention towards the C5-6 interspace.  Again a common substantial collapse was identified.  Osteophytes were removed anteriorly.  I then placed Caspar pins in the C5 and C6 vertebral bodies and distraction was applied.  I then again went forward with a diskectomy.  This particular level was extremely meticulous, given the fact that there was some significant profound collapse of the interbody space.  I was however able to encounter the posterior longitudinal ligament.  The posterior longitudinal ligament was taken down using a nerve hook followed by #1 Kerrison.  I did remove all spinal cord compression that was noted on the patient's MRI.  The neural foramina on the right and left sides were also decompressed.  I then prepared the endplates and chose a 5 mm parallel graft at this particular level.  The graft was packed with DBX mix and autograft and tamped into position in the usual fashion.  I was extremely pleased with the final press fit. Again, a diskectomy was performed at the C4-5 level in the manner described previously.  I did remove the Caspar pin from the C6 vertebral body and bone wax was placed in its place.  It was then reapplied to the C4 vertebral body and distraction was applied.  Again, this level was challenging meticulous, given the substantial collapse, however I was able to remove the posterior longitudinal ligament and the osteophytes both anteriorly and posteriorly and there was no undue compression on the spinal cord or the exiting nerves at the termination of the decompression.  I again prepared the  endplates and chose a 5 mm parallel small graft and this was filled with DBX mix in autograft tamped into position in the manner described previously.  Distraction was then discontinued and the Caspar pins were removed, the bone wax were placed in its place.  I was very pleased with the final appearance of the construct on AP and lateral fluoroscopic views.  I then chose an appropriately sized Vectra plate.  This was contoured into the appropriate degree of lordosis and placed over the anterior cervical spine.  I then placed a total two 14 mm self-drilling, self-tapping screws in each vertebral body for a total of 8 screws (C4-C7).  I was very pleased with the final press fit of each of the screws and the purchase of each of the screws.  I then thoroughly explored the wound for any undue bleeding.  There were very small bleeding vessels, which were addressed using bipolar electrocautery.  I then again obtained  a lateral intraoperative fluoroscopic view and I was very pleased with the final appearance.  At this point, the platysma was closed using 2-0 Vicryl and the skin was closed using 4-0 Monocryl.  All instrument counts were correct at the termination of the procedure.  Benzoin and Steri- Strips were applied over the wound followed by sterile dressing.  Of note, Janace Litten was my assistant throughout the procedure and aided in essential retraction and suctioning required throughout the surgery.     Estill Bamberg, MD     MD/MEDQ  D:  10/20/2011  T:  10/21/2011  Job:  [id number]  cc:   Tally Joe, M.D.

## 2011-10-21 NOTE — Progress Notes (Signed)
Patient doing well.   BP 121/60  Pulse 74  Temp 97.5 F (36.4 C) (Oral)  Resp 16  SpO2 96%  Collar well-applied, patient looks excellent. NVI Dressing CDI  S/p C4-7 acdf  philly collar to bedside Maintain aspen collar D/c home F/u 2 weeks

## 2011-10-21 NOTE — Progress Notes (Signed)
UR COMPLETED  

## 2011-10-21 NOTE — Progress Notes (Signed)
Orthopedic Tech Progress Note Patient Details:  Lacey Silva Nov 25, 1950 [id number]  Patient ID: Joseph Art, female   DOB: Mar 22, 1951, 61 y.o.   MRN: [id number]   Shawnie Pons 10/21/2011, 11:09 AM philly collar

## 2011-10-22 NOTE — Discharge Summary (Signed)
NAME:  Lacey Silva, Lacey Silva                ACCOUNT NO.:  [id number]  MEDICAL RECORD NO.:  [id number]  LOCATION:  5N19C                        FACILITY:  [facility]  PHYSICIAN:  Estill Bamberg, MD      DATE OF BIRTH:  03-01-1951  DATE OF ADMISSION:  10/20/2011 DATE OF DISCHARGE:  10/21/2011                              DISCHARGE SUMMARY   ADMISSION DIAGNOSES: 1. 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 2. Cervical myelopathy.  DISCHARGE DIAGNOSES: 1. 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 2. Cervical myelopathy.  ADMISSION HISTORY:  Briefly, [name] is a very pleasant 61 year old female who initially presented to me in July with profound deterioration of her fine motor skills.  An MRI was notable for profound degenerative disk disease of her cervical spine in addition to spinal cord compression.  We then had a discussion regarding the natural history of myelopathy and we did decide to go forward with an anterior cervical decompressive procedure with the fusion.  The patient was therefore admitted on October 20, 2011, for that procedure.  HOSPITAL COURSE:  On October 20, 2011, the patient was brought to surgery and underwent a three-level ACDF as reflected above.  The patient tolerated the procedure well and was transferred to recovery in stable condition.  The patient was evaluated by me postoperatively and on the morning of postoperative day #1.  The patient did appear to be very comfortable.  Her collar was fitting her appropriately.  She was tolerating a general diet and was uneventfully discharged home on the morning of postoperative day #1.  DISCHARGE INSTRUCTIONS:  The patient will wear her [place] collar at all times.  She was given a [place] collar to be used while showering. The patient will follow up in my office at approximately 2 weeks after her surgery.  She will take Percocet for pain and Valium for spasms.     Estill Bamberg, MD     MD/MEDQ   D:  10/21/2011  T:  10/22/2011  Job:  [id number]  cc:   Tally Joe, M.D.

## 2013-07-27 ENCOUNTER — Ambulatory Visit
Admission: RE | Admit: 2013-07-27 | Discharge: 2013-07-27 | Disposition: A | Discharge status: Home / Self Care | Payer: BC Managed Care – PPO | Source: Ambulatory Visit | Attending: Family Medicine | Admitting: Family Medicine

## 2013-07-27 ENCOUNTER — Other Ambulatory Visit: Payer: Self-pay | Admitting: Family Medicine

## 2013-07-27 DIAGNOSIS — M25539 Pain in unspecified wrist: Secondary | ICD-10-CM

## 2014-02-08 ENCOUNTER — Telehealth: Payer: Self-pay | Admitting: Neurology

## 2014-02-08 NOTE — Telephone Encounter (Signed)
We got the patient resch since dr patel will be out of the office

## 2014-03-18 ENCOUNTER — Ambulatory Visit: Payer: BC Managed Care – PPO | Admitting: Neurology

## 2014-04-01 ENCOUNTER — Ambulatory Visit (INDEPENDENT_AMBULATORY_CARE_PROVIDER_SITE_OTHER): Payer: BC Managed Care – PPO | Admitting: Neurology

## 2014-04-01 ENCOUNTER — Encounter: Payer: Self-pay | Admitting: Neurology

## 2014-04-01 VITALS — BP 145/100 | HR 83 | Ht 61.0 in | Wt 238.3 lb

## 2014-04-01 DIAGNOSIS — G5602 Carpal tunnel syndrome, left upper limb: Secondary | ICD-10-CM

## 2014-04-01 NOTE — Patient Instructions (Addendum)
1.  Nerve conduction study/EMG of the left > right hand 2.  Return to clinic in 4-6 weeks

## 2014-04-01 NOTE — Progress Notes (Signed)
[facility] Neu[facility] Clinic Note - Initial Visit   Date: 04/01/2014  Lacey Silva MRN: [id number] DOB: [birth year]   Dear Dr Azucena Cecil:  Thank you for your kind referral of Lacey Silva for consultation of bilateral hand paresthesias. Although her history is well known to you, please allow [name] to reiterate it for the purpose of our medical record. The patient was accompanied to the clinic by self.   History of Present Illness: Lacey Silva is a 64 y.o. right-handed Caucasian female with OSA on CPAP, hypertension, pulmonary sarcoidosis (in remission) and anxiety/depression presenting for evaluation of bilateral hand numbness.    Around [place] 2015, she started having intermittent numbness of her hands (left > right), which is worse when driving.  She does not rest her elbow on the arm rest or door when driving.  She has not noticed anything that triggers it, except that it is more noticeable when driving.  Symptom are present on rare occasions at night, but improves with shaking her hand.  She has noticed that when she is keeping her arms outstretching, numbness/tingling.  No weakness or neck pain.  In 2013, she developed neck pain and stiffness and underwent cervical fusion which resolved the pain.   Out-side paper records, electronic medical record, and images have been reviewed where available and summarized as:  Lab 01/21/2014:  Vitamin B12 308 Lab 09/10/2013:  Vitamin D 36.3, TSH 1.0   XR cervical 10/20/2011: Intraoperative view following lower cervical fusion. The inferior extent of the hardware is not visualized due to overlap with the patient's shoulders.  Past Medical History  Diagnosis Date  . Difficult intubation     short neck  . Hypertension   . Depression     mild and controlled  . Environmental allergies   . Sarcoidosis of lung 2002    in remission; sees MD in [place] [facility]  . Sleep apnea     uses cpap nightly  . Arthritis    osteoarthritis    Past Surgical History  Procedure Laterality Date  . Laparoscopic gastric banding  2010    [place]  . Joint replacement  2007    Right Knee  . Abdominal hysterectomy    . Rotator cuff repair  2000    Right  . Breast surgery  1993    Breast reduction  . Nasal sinus surgery  1995  . Tonsillectomy    . Combined abdominoplasty and liposuction  2013  . Anterior cervical decomp/discectomy fusion  10/20/2011    Procedure: ANTERIOR CERVICAL DECOMPRESSION/DISCECTOMY FUSION 3 LEVELS;  Surgeon: Emilee Hero, MD;  Location: [facility] OR;  Service: Orthopedics;  Laterality: Right;  Anterior cervical decompression fusion, cervical 4-5, cervical 5-6, cervical 6-7 with instrumentation and allograft.     Medications:  Current Outpatient Prescriptions on File Prior to Visit  Medication Sig Dispense Refill  . DULoxetine (CYMBALTA) 30 MG capsule Take 30 mg by mouth daily.    Marland Kitchen losartan-hydrochlorothiazide (HYZAAR) 50-12.5 MG per tablet Take 1 tablet by mouth daily.    . Multiple Vitamin (MULTIVITAMIN WITH MINERALS) TABS Take 1 tablet by mouth daily.    . polycarbophil (FIBERCON) 625 MG tablet Take 1,250 mg by mouth at bedtime.    . Vitamin D, Ergocalciferol, (DRISDOL) 50000 UNITS CAPS Take 50,000 Units by mouth 2 (two) times a week.     No current facility-administered medications on file prior to visit.    Allergies:  Allergies  Allergen Reactions  . Codeine Nausea  And Vomiting    Family History: Family History  Problem Relation Age of Onset  . Hypertension Mother   . Hypertension Father     Deceased, 46  . CAD Mother     Deceased, 32  . Depression Mother   . Hypertension Brother   . Healthy Sister     Social History: History   Social History  . Marital Status: Single    Spouse Name: N/A    Number of Children: N/A  . Years of Education: N/A   Occupational History  . Not on file.   Social History Main Topics  . Smoking status: Never Smoker   .  Smokeless tobacco: Never Used  . Alcohol Use: 0.0 oz/week    0 Not specified per week     Comment: occasional, weekends  . Drug Use: No  . Sexual Activity: No   Other Topics Concern  . 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   Highest level of education:  PhD    She lives alone.  No children.    Review of Systems:  CONSTITUTIONAL: No fevers, chills, night sweats, or weight loss.   EYES: No visual changes or eye pain ENT: No hearing changes.  No history of nose bleeds.   RESPIRATORY: No cough, wheezing and shortness of breath.   CARDIOVASCULAR: Negative for chest pain, and palpitations.   GI: Negative for abdominal discomfort, blood in stools or black stools.  No recent change in bowel habits.   GU:  No history of incontinence.   MUSCLOSKELETAL: No history of joint pain or swelling.  No myalgias.   SKIN: Negative for lesions, rash, and itching.   HEMATOLOGY/ONCOLOGY: Negative for prolonged bleeding, bruising easily, and swollen nodes.  No history of cancer.   ENDOCRINE: Negative for cold or heat intolerance, polydipsia or goiter.   PSYCH:  No depression or anxiety symptoms.   NEURO: As Above.   Vital Signs:  BP 145/100 mmHg  Pulse 83  Ht  (1.549 m)  Wt 238 lb 4.8 oz (108.092 kg)  BMI 45.05 kg/m2  SpO2 97%   General Medical Exam:   General:  Obese, well appearing, comfortable.   Eyes/ENT: see cranial nerve examination.   Neck: No masses appreciated.  Full range of motion without tenderness.  No carotid bruits. Respiratory:  Clear to auscultation, good air entry bilaterally.   Cardiac:  Regular rate and rhythm, no murmur.   Extremities:  No deformities, edema, or skin discoloration.  Skin:  No rashes or lesions.  Neurological Exam: MENTAL STATUS including orientation to time, place, person, recent and remote memory, attention span and concentration, language, and fund of knowledge is normal.   Speech is not dysarthric.  CRANIAL NERVES: II:  No visual field defects.  Unremarkable fundi.   III-IV-VI: Pupils equal round and reactive to light.  Normal conjugate, extra-ocular eye movements in all directions of gaze.  No nystagmus.  No ptosis.   V:  Normal facial sensation.     VII:  Normal facial symmetry and movements.  No pathologic facial reflexes.  VIII:  Normal hearing and vestibular function.   IX-X:  Normal palatal movement.   XI:  Normal shoulder shrug and head rotation.   XII:  Normal tongue strength and range of motion, no deviation or fasciculation.  MOTOR:  No atrophy, fasciculations or abnormal movements.  No pronator drift.  Tone is normal.  Right Upper Extremity:    Left Upper Extremity:    Deltoid  5/5   Deltoid  5/5   Biceps  5/5   Biceps  5/5   Triceps  5/5   Triceps  5/5   Wrist extensors  5/5   Wrist extensors  5/5   Wrist flexors  5/5   Wrist flexors  5/5   Finger extensors  5/5   Finger extensors  5/5   Finger flexors  5/5   Finger flexors  5/5   Dorsal interossei  5/5   Dorsal interossei  5/5   Abductor pollicis  5/5   Abductor pollicis  5/5   Tone (Ashworth scale)  0  Tone (Ashworth scale)  0   Right Lower Extremity:    Left Lower Extremity:    Hip flexors  5/5   Hip flexors  5/5   Hip extensors  5/5   Hip extensors  5/5   Knee flexors  5/5   Knee flexors  5/5   Knee extensors  5/5   Knee extensors  5/5   Dorsiflexors  5/5   Dorsiflexors  5/5   Plantarflexors  5/5   Plantarflexors  5/5   Toe extensors  5/5   Toe extensors  5/5   Toe flexors  5/5   Toe flexors  5/5   Tone (Ashworth scale)  0  Tone (Ashworth scale)  0   MSRs:  Right                                                                 Left brachioradialis 3+  brachioradialis 3+  biceps 3+  biceps 3+  triceps 3+  triceps 3+  patellar 3+  patellar 3+  ankle jerk 2+  ankle jerk 2+  Hoffman no  Hoffman no  plantar response down  plantar response down   SENSORY:  Normal and symmetric  perception of light touch, pinprick, vibration, and proprioception.  Romberg's sign absent.   COORDINATION/GAIT: Normal finger-to- nose-finger and heel-to-shin.  Intact rapid alternating movements bilaterally.  Able to rise from a chair without using arms.  Gait is slightly antalgic due to knee pain.    IMPRESSION: Lacey Silva is a 64 year-old female presenting for evaluation of bilateral hand paresthesias.  Her exam is notable for generalized hyperreflexia which is possibly due to cervical canal stenosis.  No evidence of weakness of sensory deficits on exam.  High clinical suspicion for carpal tunnel syndrome more so than ulnar nerve entrapment or cervical radiculopathy.  Electrodiagnostic testing will be performed to better localize her symptoms.  Return to clinic after testing.    The duration of this appointment visit was 45 minutes of face-to-face time with the patient.  Greater than 50% of this time was spent in counseling, explanation of diagnosis, planning of further management, and coordination of care.   Thank you for allowing me to participate in patient's care.  If I can answer any additional questions, I would be pleased to do so.    Sincerely,    Cristabel Bicknell K. Allena KatzPatel, DO

## 2014-04-08 ENCOUNTER — Ambulatory Visit (INDEPENDENT_AMBULATORY_CARE_PROVIDER_SITE_OTHER): Payer: BC Managed Care – PPO | Admitting: Neurology

## 2014-04-08 DIAGNOSIS — G5601 Carpal tunnel syndrome, right upper limb: Secondary | ICD-10-CM

## 2014-04-08 DIAGNOSIS — G5602 Carpal tunnel syndrome, left upper limb: Secondary | ICD-10-CM

## 2014-04-08 NOTE — Procedures (Signed)
[facility] Neurology  [address], [address]  [place], [place] [address] Tel: [phone] Fax:  [phone] Test Date:  04/08/2014  Patient: Lacey Silva DOB: 04/13/1981 Physician: Nita Sickle, DO  Sex: Female Height:  Ref Phys: Nita Sickle  ID#: [id number] Temp: 35.0C Technician: Ala Bent R. NCS T.   Patient Complaints: Patient is a 64 year old female here for evaluation of bilateral hand paresthesias, left worse than right.  NCV & EMG Findings: Extensive electrodiagnostic testing of the left upper extremity and additional studies of the right shows:  1. Left median sensory response is absent on the right median sensory response shows prolonged latency and reduced amplitude. Bilateral ulnar and radial sensory responses are within normal limits. 2. Bilateral left median motor responses are prolonged with reduced amplitudes. Bilateral ulnar motor responses are within normal limits. 3. Chronic motor axon loss changes are isolated to the left abductor pollicis brevis, without accompanied active denervation.  Impression: Bilateral median neuropathy at or distal to the wrist, consistent with the clinical diagnosis of carpal tunnel syndrome. Overall, these findings are severe in degree electrically and worse on the left.   ___________________________ Nita Sickle, DO    Nerve Conduction Studies Anti Sensory Summary Table   Site NR Peak (ms) Norm Peak (ms) P-T Amp (V) Norm P-T Amp  Left Median Anti Sensory (2nd Digit)  35C  Wrist NR  <3.4  >20  Right Median Anti Sensory (2nd Digit)  35C  Wrist    4.5 <3.4 14.8 >20  Left Radial Anti Sensory (Base 1st Digit)  35C  Wrist    2.1 <2.7 21.0 >18  Right Radial Anti Sensory (Base 1st Digit)  35C  Wrist    2.1 <2.7 30.3 >18  Left Ulnar Anti Sensory (5th Digit)  35C  Wrist    2.7 <3.1 29.6 >12  Right Ulnar Anti Sensory (5th Digit)  35C  Wrist    2.8 <3.1 23.4 >12   Motor Summary Table   Site NR Onset (ms) Norm  Onset (ms) O-P Amp (mV) Norm O-P Amp Site1 Site2 Delta-0 (ms) Dist (cm) Vel (m/s) Norm Vel (m/s)  Left Median Motor (Abd Poll Brev)  35C  Wrist    5.2 <3.9 3.7 >6 Elbow Wrist 4.5 23.0 51 >50  Elbow    9.7  3.5         Right Median Motor (Abd Poll Brev)  35C  Wrist    4.5 <3.9 4.7 >6 Elbow Wrist 3.7 23.0 62 >50  Elbow    8.2  4.0         Left Ulnar Motor (Abd Dig Minimi)  35C  Wrist    2.1 <3.1 13.1 >7 B Elbow Wrist 3.1 19.0 61 >50  B Elbow    5.2  12.3  A Elbow B Elbow 1.6 10.0 63 >50  A Elbow    6.8  12.2         Right Ulnar Motor (Abd Dig Minimi)  35C  Wrist    2.0 <3.1 11.1 >7 B Elbow Wrist 3.3 19.0 58 >50  B Elbow    5.3  11.0  A Elbow B Elbow 1.6 10.0 62 >50  A Elbow    6.9  10.7          F Wave Studies   NR F-Lat (ms) Lat Norm (ms) L-R F-Lat (ms)  Left Ulnar (Mrkrs) (Abd Dig Min)  35C     23.88 <33 0.00  Right Ulnar (Mrkrs) (  Abd Dig Min)  35C     23.88 <33 0.00   EMG   Side Muscle Ins Act Fibs Psw Fasc Number Recrt Dur Dur. Amp Amp. Poly Poly. Comment  Right 1stDorInt Nml Nml Nml Nml Nml Nml Nml Nml Nml Nml Nml Nml N/A  Right Abd Poll Brev Nml Nml Nml Nml Nml Nml Nml Nml Nml Nml Nml Nml N/A  Right Ext Indicis Nml Nml Nml Nml Nml Nml Nml Nml Nml Nml Nml Nml N/A  Right PronatorTeres Nml Nml Nml Nml Nml Nml Nml Nml Nml Nml Nml Nml N/A  Right Biceps Nml Nml Nml Nml Nml Nml Nml Nml Nml Nml Nml Nml N/A  Right Triceps Nml Nml Nml Nml Nml Nml Nml Nml Nml Nml Nml Nml N/A  Right Deltoid Nml Nml Nml Nml Nml Nml Nml Nml Nml Nml Nml Nml N/A  Left 1stDorInt Nml Nml Nml Nml Nml Nml Nml Nml Nml Nml Nml Nml N/A  Left Abd Poll Brev Nml Nml Nml Nml 1- Rapid Few 1+ Some 1+ Nml Nml N/A  Left Ext Indicis Nml Nml Nml Nml Nml Nml Nml Nml Nml Nml Nml Nml N/A  Left PronatorTeres Nml Nml Nml Nml Nml Nml Nml Nml Nml Nml Nml Nml N/A  Left Triceps Nml Nml Nml Nml Nml Nml Nml Nml Nml Nml Nml Nml N/A      Waveforms:

## 2014-05-10 HISTORY — PX: KNEE SURGERY: SHX244

## 2014-05-13 ENCOUNTER — Ambulatory Visit: Payer: BC Managed Care – PPO | Admitting: Neurology

## 2014-05-21 ENCOUNTER — Ambulatory Visit: Payer: BC Managed Care – PPO | Admitting: Neurology

## 2014-05-29 ENCOUNTER — Ambulatory Visit (INDEPENDENT_AMBULATORY_CARE_PROVIDER_SITE_OTHER): Payer: BC Managed Care – PPO | Admitting: Neurology

## 2014-05-29 ENCOUNTER — Encounter: Payer: Self-pay | Admitting: Neurology

## 2014-05-29 VITALS — BP 124/80 | HR 100 | Ht 61.0 in | Wt 241.0 lb

## 2014-05-29 DIAGNOSIS — G5602 Carpal tunnel syndrome, left upper limb: Secondary | ICD-10-CM

## 2014-05-29 DIAGNOSIS — G5600 Carpal tunnel syndrome, unspecified upper limb: Secondary | ICD-10-CM | POA: Insufficient documentation

## 2014-05-29 DIAGNOSIS — G5601 Carpal tunnel syndrome, right upper limb: Secondary | ICD-10-CM

## 2014-05-29 NOTE — Patient Instructions (Signed)
[facility] Continue to use your wrist splints Return to clinic as needed

## 2014-05-29 NOTE — Progress Notes (Signed)
Follow-up Visit   Date: 05/29/2014    RASHMI TALLENT MRN: [id number] DOB: 12-20-1950   Interim History: Lacey Silva is a 64 y.o. right-handed Caucasian female with OSA on CPAP, hypertension, pulmonary sarcoidosis (in remission) and anxiety/depression returning to the clinic for follow-up of bilateral CTS.  The patient was accompanied to the clinic by self.  History of present illness: Around [place] 2015, she started having intermittent numbness of her hands (left > right), which is worse when driving. She does not rest her elbow on the arm rest or door when driving. She has not noticed anything that triggers it, except that it is more noticeable when driving. Symptom are present on rare occasions at night, but improves with shaking her hand. She has noticed that when she is keeping her arms outstretching, numbness/tingling. No weakness or neck pain.  In 2013, she developed neck pain and stiffness and underwent cervical fusion which resolved the pain.  UPDATE 05/29/2014: Patient underwent EMG in January which showed bilateral CTSShe is using bilateral wrist splint and reports significant improvement (80%) of numbness/tingling when wearing it. She is not dropping objects, but continues to have mild difficulty of fine motor skills such as putting on earrings.  Medications:  Current Outpatient Prescriptions on File Prior to Visit  Medication Sig Dispense Refill  . DULoxetine (CYMBALTA) 30 MG capsule Take 30 mg by mouth daily.    Marland Kitchen losartan-hydrochlorothiazide (HYZAAR) 50-12.5 MG per tablet Take 1 tablet by mouth daily.    . Multiple Vitamin (MULTIVITAMIN WITH MINERALS) TABS Take 1 tablet by mouth daily.    . polycarbophil (FIBERCON) 625 MG tablet Take 1,250 mg by mouth at bedtime.    . Vitamin D, Ergocalciferol, (DRISDOL) 50000 UNITS CAPS Take 50,000 Units by mouth 2 (two) times a week.     No current facility-administered medications on file prior to visit.    Allergies:    Allergies  Allergen Reactions  . Codeine Nausea And Vomiting    Review of Systems:  CONSTITUTIONAL: No fevers, chills, night sweats, or weight loss.  EYES: No visual changes or eye pain ENT: No hearing changes.  No history of nose bleeds.   RESPIRATORY: No cough, wheezing and shortness of breath.   CARDIOVASCULAR: Negative for chest pain, and palpitations.   GI: Negative for abdominal discomfort, blood in stools or black stools.  No recent change in bowel habits.   GU:  No history of incontinence.   MUSCLOSKELETAL: No history of joint pain or swelling.  No myalgias.   SKIN: Negative for lesions, rash, and itching.   ENDOCRINE: Negative for cold or heat intolerance, polydipsia or goiter.   PSYCH:  No depression or anxiety symptoms.   NEURO: As Above.   Vital Signs:  BP 124/80 mmHg  Pulse 100  Ht  (1.549 m)  Wt 241 lb (109.317 kg)  BMI 45.56 kg/m2  Neurological Exam: MENTAL STATUS including orientation to time, place, person, recent and remote memory, attention span and concentration, language, and fund of knowledge is normal.  Speech is not dysarthric.  CRANIAL NERVES:  Pupils equal round and reactive to light. Face is symmetric.  MOTOR:  Motor strength is 5/5 in all extremities, except left ABP is 4/5.  Mild L > R ABP atrophy.  MSRs:  Reflexes are 3+/4 in the upper extremities and 2+/4 in the lower extremities.  SENSORY:  Reduced pin prick of the medial distribution bilaterally.  COORDINATION/GAIT:  Normal finger-to- nose-finger and heel-to-shin.  Intact rapid  alternating movements bilaterally.  Gait narrow based and stable.   Data: EMG 04/08/2014:  Bilateral median neuropathy at or distal to the wrist, consistent with the clinical diagnosis of carpal tunnel syndrome. Overall, these findings are severe in degree electrically and worse on the left.  Lab 01/21/2014: Vitamin B12 308 Lab 09/10/2013: Vitamin D 36.3, TSH 1.0  XR cervical 10/20/2011:  Intraoperative view  following lower cervical fusion. The inferior extent of the hardware is not visualized due to overlap with the patient's shoulders.  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  Referral to [facility] Mack Hookavid Thompson [facility] for bilateral CTS 2.  Continue to use hand wrist splints 3.  Return to clinic as needed   The duration of this appointment visit was 15 minutes of face-to-face time with the patient.  Greater than 50% of this time was spent in counseling, explanation of diagnosis, planning of further management, and coordination of care.   Thank you for allowing me to participate in patient's care.  If I can answer any additional questions, I would be pleased to do so.    Sincerely,    Donika K. Allena KatzPatel, DO

## 2017-11-11 ENCOUNTER — Other Ambulatory Visit: Payer: Self-pay | Admitting: Family Medicine

## 2017-11-11 DIAGNOSIS — M7989 Other specified soft tissue disorders: Secondary | ICD-10-CM

## 2017-12-14 ENCOUNTER — Other Ambulatory Visit: Payer: Self-pay | Admitting: Family Medicine

## 2017-12-14 DIAGNOSIS — R220 Localized swelling, mass and lump, head: Secondary | ICD-10-CM

## 2017-12-26 ENCOUNTER — Other Ambulatory Visit: Payer: BC Managed Care – PPO

## 2017-12-29 ENCOUNTER — Other Ambulatory Visit: Payer: Self-pay

## 2018-01-04 ENCOUNTER — Other Ambulatory Visit: Payer: Self-pay

## 2018-03-16 ENCOUNTER — Ambulatory Visit
Admission: RE | Admit: 2018-03-16 | Discharge: 2018-03-16 | Disposition: A | Discharge status: Home / Self Care | Payer: Medicare Other | Source: Ambulatory Visit | Attending: Family Medicine | Admitting: Family Medicine

## 2018-03-16 DIAGNOSIS — R220 Localized swelling, mass and lump, head: Secondary | ICD-10-CM

## 2018-03-17 ENCOUNTER — Other Ambulatory Visit: Payer: Self-pay | Admitting: Family Medicine

## 2018-03-17 DIAGNOSIS — M7989 Other specified soft tissue disorders: Secondary | ICD-10-CM

## 2018-03-23 ENCOUNTER — Ambulatory Visit
Admission: RE | Admit: 2018-03-23 | Discharge: 2018-03-23 | Disposition: A | Discharge status: Home / Self Care | Payer: Medicare Other | Source: Ambulatory Visit | Attending: Family Medicine | Admitting: Family Medicine

## 2018-03-23 DIAGNOSIS — M7989 Other specified soft tissue disorders: Secondary | ICD-10-CM

## 2020-02-25 IMAGING — CT CT HEAD W/O CM
1 series · 16 of 30 positions shown, 20 images · non-contrast
Comparison: None.

CLINICAL DATA: Right occipital nodule

EXAM:
CT HEAD WITHOUT CONTRAST
TECHNIQUE: Contiguous axial images were obtained from the base of the skull
through the vertex without intravenous contrast.

[Series 2: head w/(date) · axial · 0.47mm/px · z∈[-115,+40]mm · 16 of 35 slices shown, 20 images]
[im 2/35  brain]
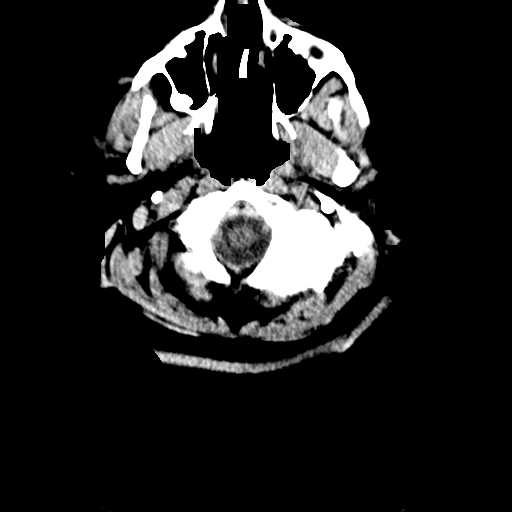
[im 2/35  bone]
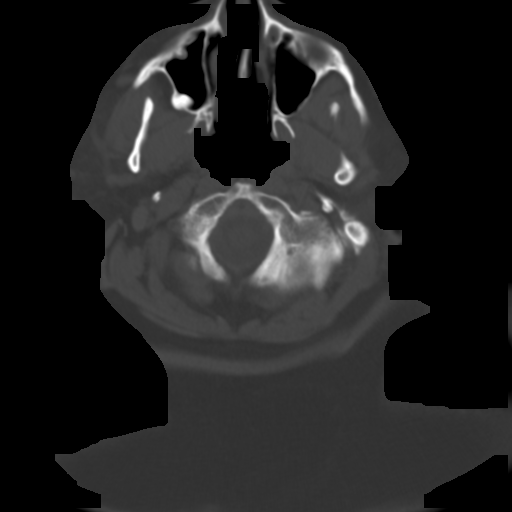
[im 4/35  brain]
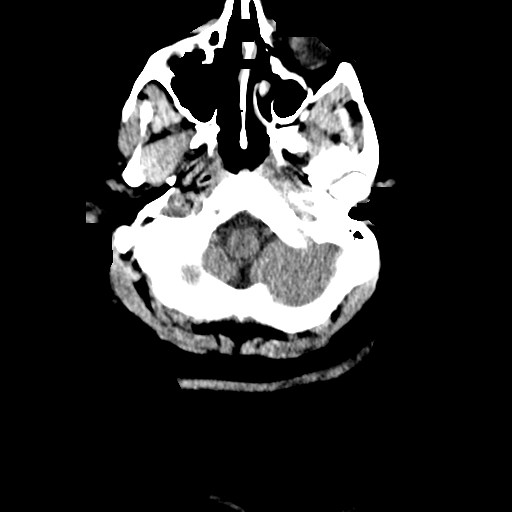
[im 6/35  brain]
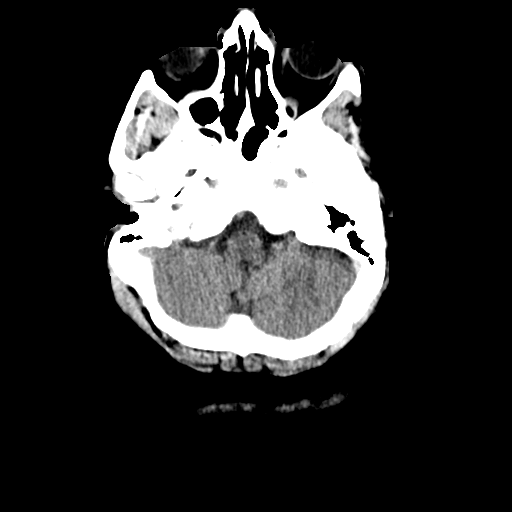
[im 9/35  brain]
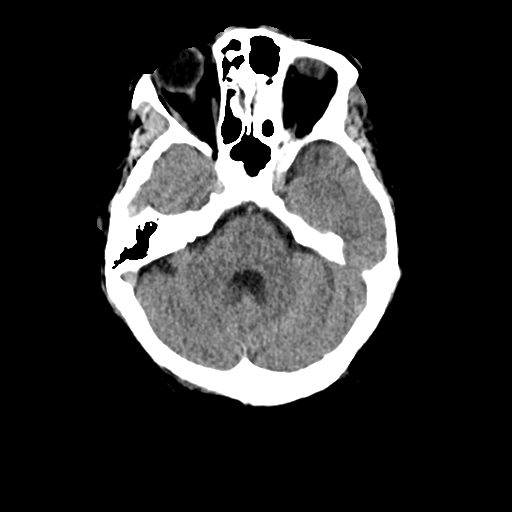
[im 10/35  brain]
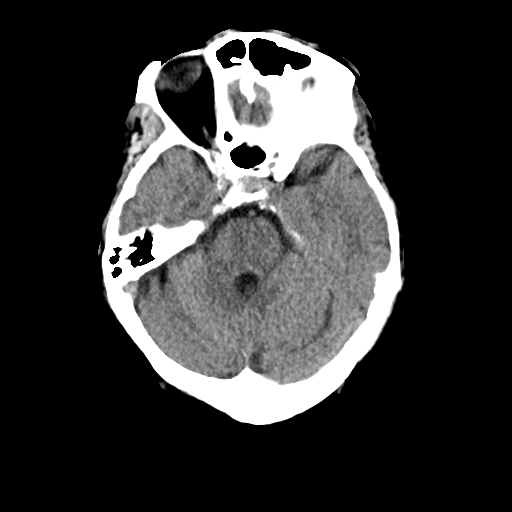
[im 10/35  bone]
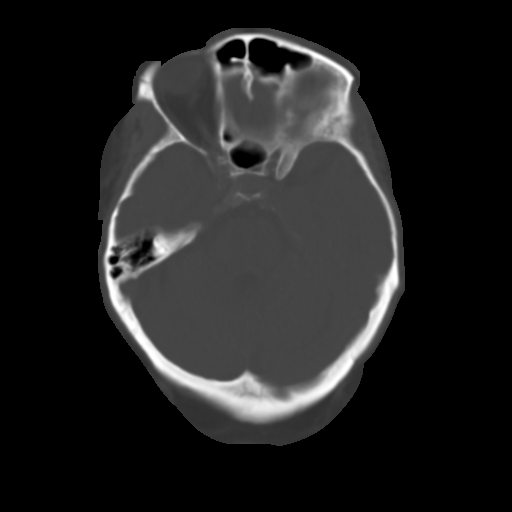
[im 12/35  brain]
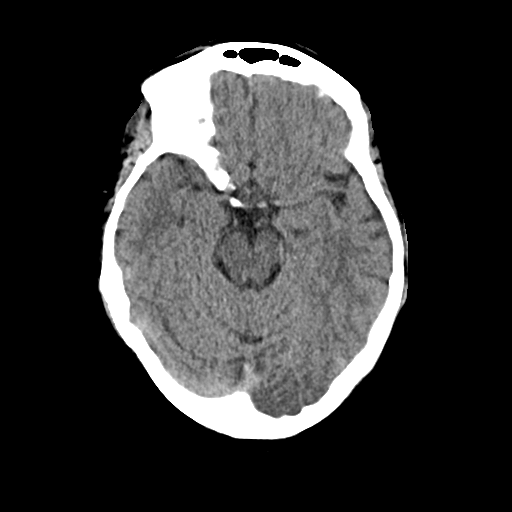
[im 15/35  brain]
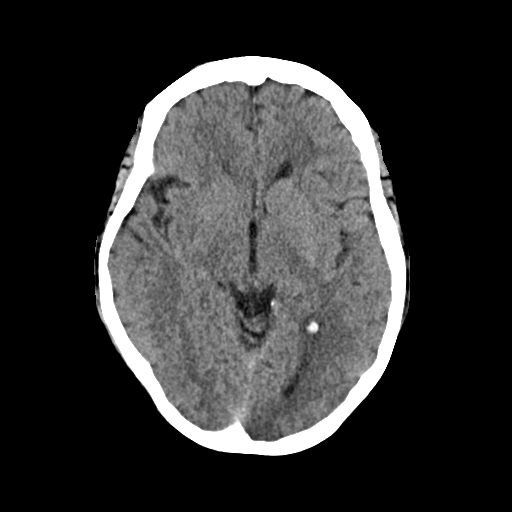
[im 17/35  brain]
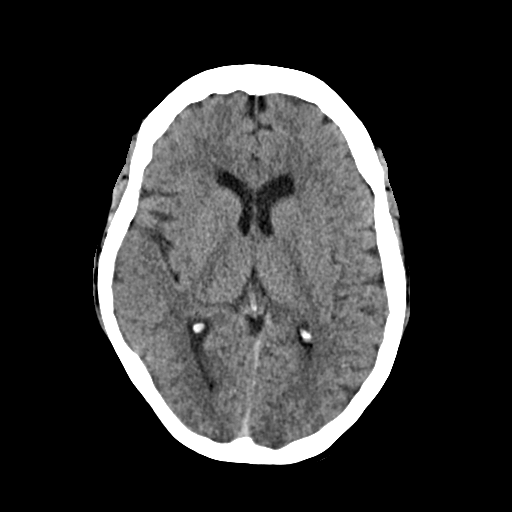
[im 18/35  brain]
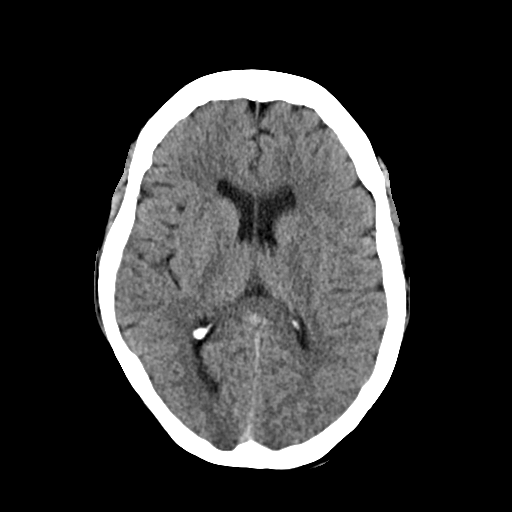
[im 18/35  bone]
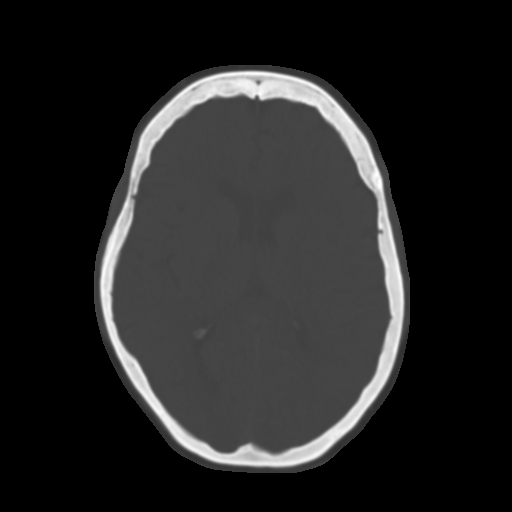
[im 20/35  brain]
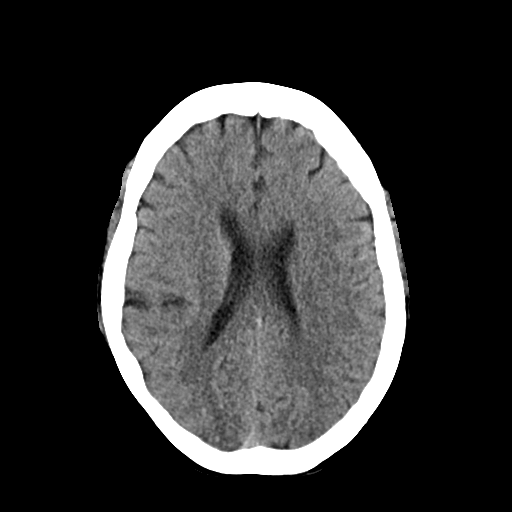
[im 23/35  brain]
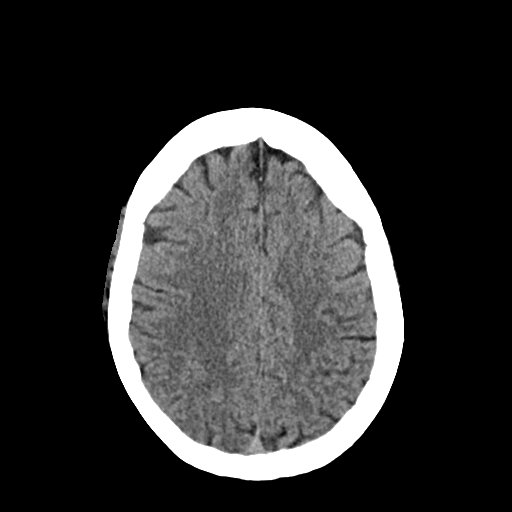
[im 25/35  brain]
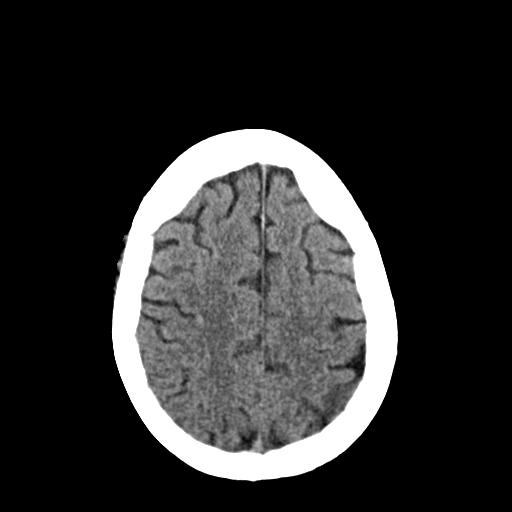
[im 26/35  brain]
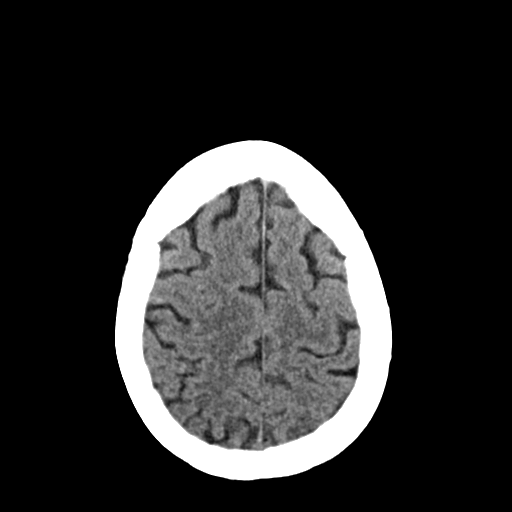
[im 26/35  bone]
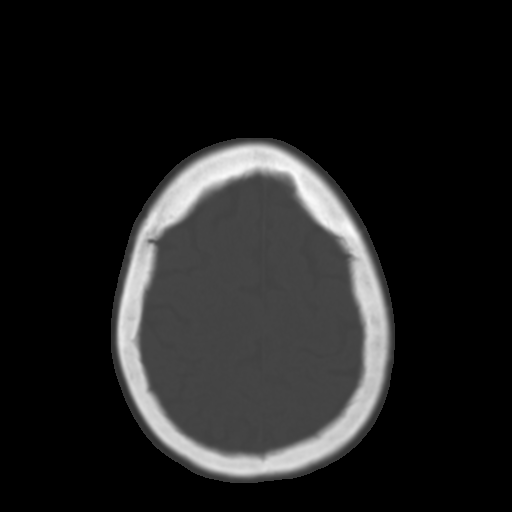
[im 29/35  brain]
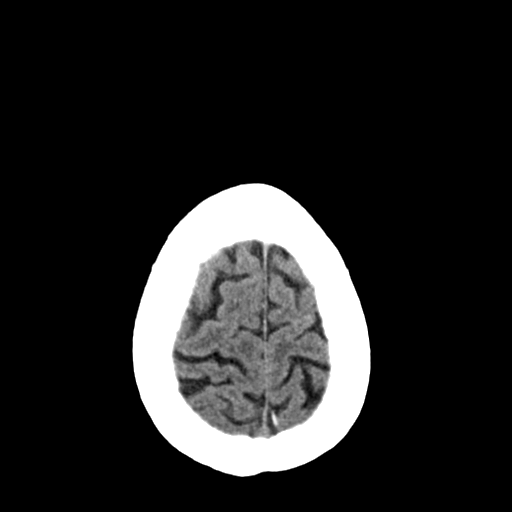
[im 31/35  brain]
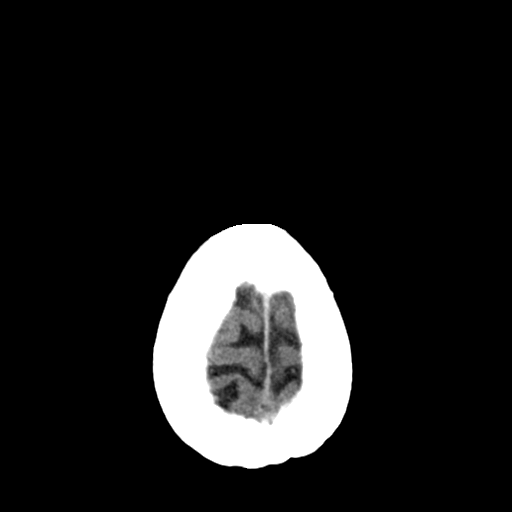
[im 33/35  brain]
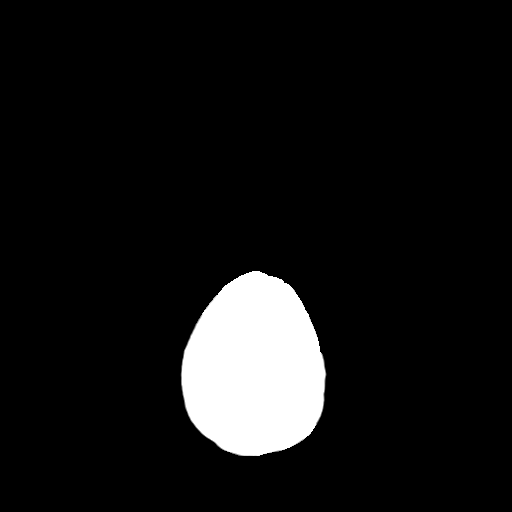

[16 of 30 positions shown; findings below may reference images not displayed]

FINDINGS: Brain: No acute territorial infarction, hemorrhage or intracranial
mass. The ventricles are nonenlarged.

Vascular: No hyperdense vessels.  No unexpected calcification

Skull: No fracture

Sinuses/Orbits: Postsurgical changes of the maxillary and ethmoid
sinuses with mild mucosal thickening.

Other: Palpable marker noted in the right suboccipital region,
posterior to the mastoid. No discrete mass is visualized in the
region. There is slight asymmetric fatty deposition in the
subcutaneous tissues.
IMPRESSION: 1. Slight asymmetric fatty deposition in the subcutaneous soft
tissues of the right suboccipital region near the palpable marker,
but without circumscribed mass or cyst.
2. Negative non contrasted CT appearance of the brain.
